# Patient Record
Sex: Female | Born: 1960 | Race: White | Hispanic: No | Marital: Married
Health system: Southern US, Community
[De-identification: ages and names within clinical notes are randomized; demographics above are authoritative.]

---

## 2006-09-12 ENCOUNTER — Other Ambulatory Visit: Admission: RE | Admit: 2006-09-12 | Discharge: 2006-09-12 | Payer: Self-pay | Admitting: Obstetrics and Gynecology

## 2007-05-09 ENCOUNTER — Ambulatory Visit (HOSPITAL_COMMUNITY): Admission: RE | Admit: 2007-05-09 | Discharge: 2007-05-09 | Payer: Self-pay | Admitting: Obstetrics and Gynecology

## 2007-12-23 ENCOUNTER — Other Ambulatory Visit: Admission: RE | Admit: 2007-12-23 | Discharge: 2007-12-23 | Payer: Self-pay | Admitting: Obstetrics and Gynecology

## 2008-05-11 ENCOUNTER — Ambulatory Visit (HOSPITAL_COMMUNITY): Admission: RE | Admit: 2008-05-11 | Discharge: 2008-05-11 | Payer: Self-pay | Admitting: Obstetrics and Gynecology

## 2008-05-12 ENCOUNTER — Ambulatory Visit (HOSPITAL_COMMUNITY): Admission: RE | Admit: 2008-05-12 | Discharge: 2008-05-13 | Payer: Self-pay | Admitting: Obstetrics and Gynecology

## 2008-05-12 ENCOUNTER — Encounter: Payer: Self-pay | Admitting: Obstetrics and Gynecology

## 2009-07-26 ENCOUNTER — Ambulatory Visit (HOSPITAL_COMMUNITY): Admission: RE | Admit: 2009-07-26 | Discharge: 2009-07-26 | Payer: Self-pay | Admitting: Obstetrics and Gynecology

## 2011-01-10 NOTE — Op Note (Signed)
[place]            ACCOUNT NO.:  [id number]   MEDICAL RECORD NO.:  [id number]          PATIENT TYPE:  OIB   LOCATION:  1320                         FACILITY:  [facility]   PHYSICIAN:  Cynthia P. Romine, M.D.DATE OF BIRTH:  07-07-1961   DATE OF PROCEDURE:  05/13/2008  DATE OF DISCHARGE:  05/13/2008                               OPERATIVE REPORT   PREOPERATIVE DIAGNOSES:  1. Menorrhagia.  2. Large uterine fibroids.   POSTOPERATIVE DIAGNOSES:  1. Menorrhagia.  2. Large uterine fibroids.  3. Pathology pending.   PROCEDURE:  1. Attempt at robotic total laparoscopic hysterectomy, abandoned after      the laparoscope was inserted due to uterine anatomy being not      amenable to the robotic approach.  2. Subsequent total abdominal hysterectomy.   SURGEON:  Cynthia P. Romine, MD   ASSISTANT:  Lum Keas, MD   ANESTHESIA:  General endotracheal.   ESTIMATED BLOOD LOSS:  175 mL.   COMPLICATIONS:  None.   PROCEDURE:  The patient was taken to the operating room and after  induction of adequate general endotracheal anesthesia was prepped and  draped in the usual fashion and placed in the dorsal lithotomy position.  A sterile speculum was inserted.  The cervix was grasped on its anterior  lip with a single-tooth tenaculum and a ZUMI uterine manipulator was  placed.  The bladder was drained with a red rubber catheter.  Attention  was next turned to the abdomen.  An incision was made about 3 cm long  about 3 fingerbreadths above the umbilicus.  It was carried down to the  fascia with blunt dissection.  The fascia was elevated with Kochers and  entered with Mayo scissors.  A pursestring suture was placed around the  fascia using 0 Vicryl.  The peritoneum was entered atraumatically.  The  Hasson sleeve was introduced into the peritoneal space.  The laparoscope  was then introduced and the pelvis inspected.  The uterus had a very  large anterior fibroid and some  adhesions of the bladder to the anterior  cervix and between the adhesions and the large anterior fibroid, it was  very difficult if not impossible to get over the fibroid and down to  create a bladder flap.  Additionally, there was a subserosal fibroid on  the patient's right at the level of the uterine artery and in addition,  the fundal fibroid made the uterus so heavy and so wide that even with  extreme manipulation I could not move the uterus away from the right  pelvic sidewall to see the tube and the round ligament.  It was felt  that a robotic approach would not be successful.  Therefore, the  decision was made to proceed with an open total abdominal hysterectomy.  The scope was removed.  The fascia was closed and the skin was closed  subcuticularly with 4-0 Vicryl Rapide and Dermabond.  A Pfannenstiel  incision was made and carried down to the fascia using the Bovie.  The  fascia was nicked and opened transversely.  Kochers were used to grasp  the fascial margins  that were separate from the underlying rectus  muscles using sharp dissection and the Bovie.  Rectus muscles were  separated in the midline.  The underlying peritoneum was elevated  between two hemostats and entered atraumatically.  The peritoneum was  opened vertically.  The uterus was as described on laparoscopy, being  very wide and deep and filling the cul-de-sac.  The tubes and ovaries  appeared normal and were retained.  With elevation of the uterus out  through the incision and traction to the patient's right side, the left  round ligament could be identified, stitched and opened down the  anterior leaf of the broad ligament to the midline.  A window was  created in the mesosalpinx and the pedicle containing the utero-ovarian  ligament and tube were clamped, cut and doubly tied.  Similarly the  procedure was repeated.  Elevating the uterus and retracting it to the  patient's left would allow identification of the  right round ligament.  This was sutured and then divided with the Bovie and the anterior leaf  of the broad ligament was taken down sharply.  Again a window was taken  and a window was made in the mesosalpinx and the pedicle containing the  tube and the utero-ovarian ligament clamped, cut and doubly tied.  Posterior leaf of the broad ligament was taken down sharply.  The  uterine arteries were skeletonized on each side.  The bladder was taken  down sharply.  The uterine arteries were clamped, cut and doubly tied on  each side.  The hysterectomy continued down the cardinal ligaments,  clamping, cutting and tying in sequence.  The uterosacral ligaments were  clamped, cut and held.  The specimen was then removed with Satinsky  scissors.  The vaginal cuff was grasped with Kochers and closed with  figure-of-eight sutures of 0 Vicryl.  Good hemostasis was achieved.  Pelvis was irrigated and was hemostatic.  The ovaries were tied up to  the round ligaments to keep them away from the vaginal cuff.  Uterosacral ligaments were tied together in the midline.  The bowel was  then allowed to return to its anatomic position after having been packed  away with a Balfour and wet lap.  The peritoneum was elevated with  hemostats and closed in a running fashion using 2-0 chromic.  A  subfascial On-Q pump was then inserted through a stab wound in the right  upper quadrant.  The fascia was then closed with a running suture of 0  Vicryl going from each angle towards the midline.  Subcutaneous tissue  was irrigated.  Hemostasis was achieved with the Bovie.  A subcutaneous  On-Q pump was then placed through a stab wound in the left upper  quadrant.  The skin was closed subcuticularly with 4-0 Vicryl Rapide,  Steri-Strips were applied, a dressing was applied, and the procedure was  terminated.  The vaginal instruments had been removed just prior to  removing the uterus and cervix.      Cynthia P. Romine,  M.D.  Electronically Signed     CPR/MEDQ  D:  05/12/2008  T:  05/13/2008  Job:  [id number]

## 2011-05-29 LAB — CBC
HCT: 31.4 — ABNORMAL LOW
Hemoglobin: 10.6 — ABNORMAL LOW
MCHC: 33.6
MCV: 93.1
Platelets: 203
RBC: 3.38 — ABNORMAL LOW
RDW: 13.8
WBC: 7.9

## 2011-05-31 LAB — CBC
HCT: 41.2
Hemoglobin: 13.7
MCHC: 33.3
MCV: 93.1
Platelets: 235
RBC: 4.42
RDW: 13.7
WBC: 5.5

## 2011-05-31 LAB — HCG, SERUM, QUALITATIVE: Preg, Serum: NEGATIVE

## 2012-03-14 ENCOUNTER — Other Ambulatory Visit (HOSPITAL_COMMUNITY): Payer: Self-pay | Admitting: Family Medicine

## 2012-03-14 DIAGNOSIS — Z1231 Encounter for screening mammogram for malignant neoplasm of breast: Secondary | ICD-10-CM

## 2012-04-03 ENCOUNTER — Ambulatory Visit (HOSPITAL_COMMUNITY)
Admission: RE | Admit: 2012-04-03 | Discharge: 2012-04-03 | Disposition: A | Discharge status: Home / Self Care | Payer: BC Managed Care – PPO | Source: Ambulatory Visit | Attending: Family Medicine | Admitting: Family Medicine

## 2012-04-03 DIAGNOSIS — Z1231 Encounter for screening mammogram for malignant neoplasm of breast: Secondary | ICD-10-CM | POA: Insufficient documentation

## 2014-01-07 ENCOUNTER — Other Ambulatory Visit (HOSPITAL_COMMUNITY): Payer: Self-pay | Admitting: Family Medicine

## 2014-01-07 DIAGNOSIS — Z1231 Encounter for screening mammogram for malignant neoplasm of breast: Secondary | ICD-10-CM

## 2014-01-09 ENCOUNTER — Ambulatory Visit (HOSPITAL_COMMUNITY)
Admission: RE | Admit: 2014-01-09 | Discharge: 2014-01-09 | Disposition: A | Discharge status: Home / Self Care | Payer: BC Managed Care – PPO | Source: Ambulatory Visit | Attending: Family Medicine | Admitting: Family Medicine

## 2014-01-09 DIAGNOSIS — Z1231 Encounter for screening mammogram for malignant neoplasm of breast: Secondary | ICD-10-CM | POA: Insufficient documentation

## 2014-04-02 ENCOUNTER — Other Ambulatory Visit: Payer: Self-pay | Admitting: Cardiology

## 2014-04-02 DIAGNOSIS — Z139 Encounter for screening, unspecified: Secondary | ICD-10-CM

## 2014-04-07 ENCOUNTER — Other Ambulatory Visit: Payer: BC Managed Care – PPO

## 2014-04-09 ENCOUNTER — Other Ambulatory Visit: Payer: BC Managed Care – PPO

## 2014-04-09 ENCOUNTER — Ambulatory Visit
Admission: RE | Admit: 2014-04-09 | Discharge: 2014-04-09 | Disposition: A | Discharge status: Home / Self Care | Payer: No Typology Code available for payment source | Source: Ambulatory Visit | Attending: Cardiology | Admitting: Cardiology

## 2014-04-09 DIAGNOSIS — Z139 Encounter for screening, unspecified: Secondary | ICD-10-CM

## 2014-04-14 ENCOUNTER — Ambulatory Visit: Payer: BC Managed Care – PPO | Admitting: Cardiology

## 2014-12-28 ENCOUNTER — Other Ambulatory Visit (HOSPITAL_COMMUNITY): Payer: Self-pay | Admitting: Obstetrics and Gynecology

## 2014-12-28 DIAGNOSIS — Z1231 Encounter for screening mammogram for malignant neoplasm of breast: Secondary | ICD-10-CM

## 2015-03-21 IMAGING — CT CT HEART SCORING
1 of 2 series · 11 of 20 positions shown, 14 images · non-contrast
Comparison: None.

CLINICAL DATA: Family history of coronary artery disease. Tightness
in chest.

EXAM:
CT HEART FOR CALCIUM SCORING
TECHNIQUE: CT heart was performed on a 256 channel system using prospective ECG
gating. A scout and noncontrast exam (for calcium scoring) were
performed. Note that this exam targets the heart and the chest was
not imaged in its entirety.

[Series 2: smartscore - gated 0.4 sec · axial · 0.41mm/px · z∈[-234,-119]mm · 11 of 56 slices shown, 14 images]
[im 5/56  vessel]
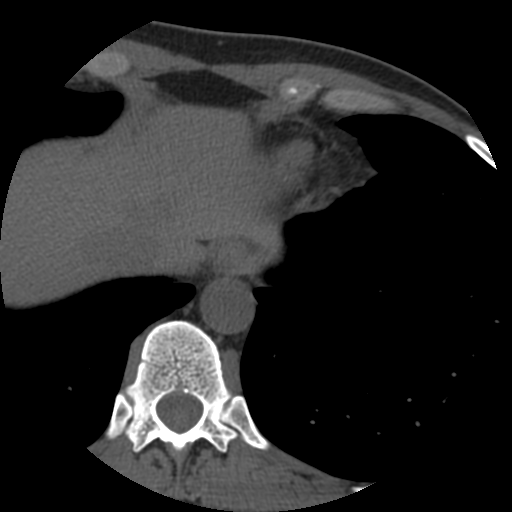
[im 5/56  lung]
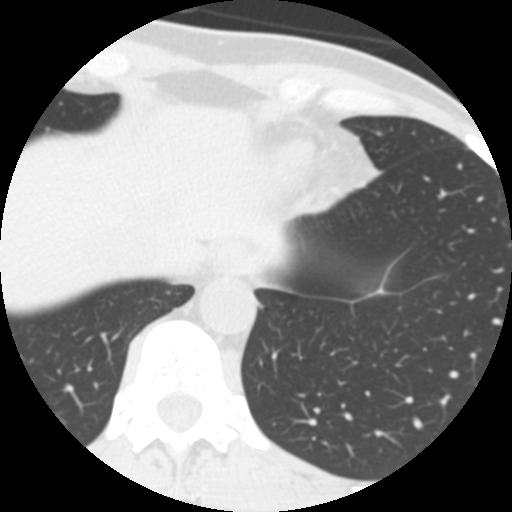
[im 10/56  vessel]
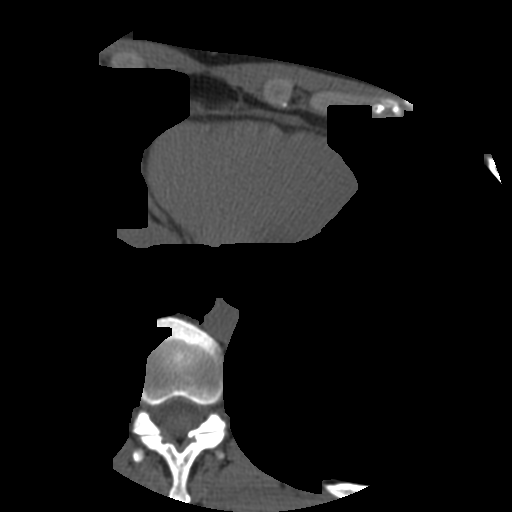
[im 14/56  vessel]
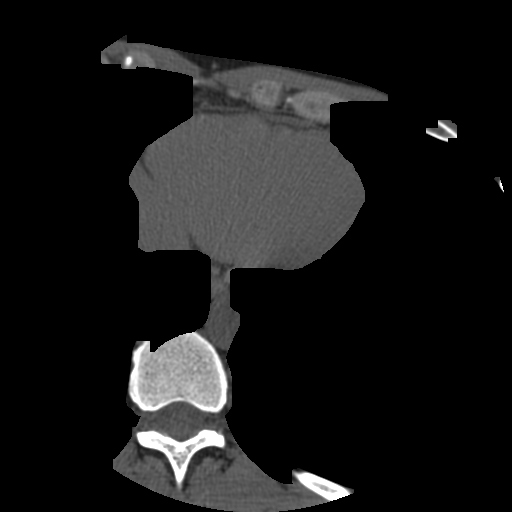
[im 19/56  vessel]
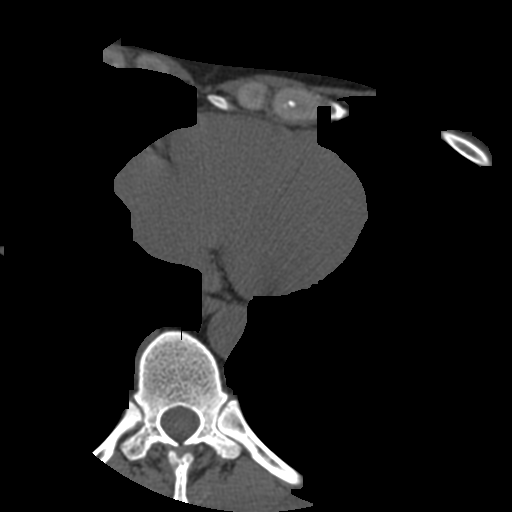
[im 23/56  vessel]
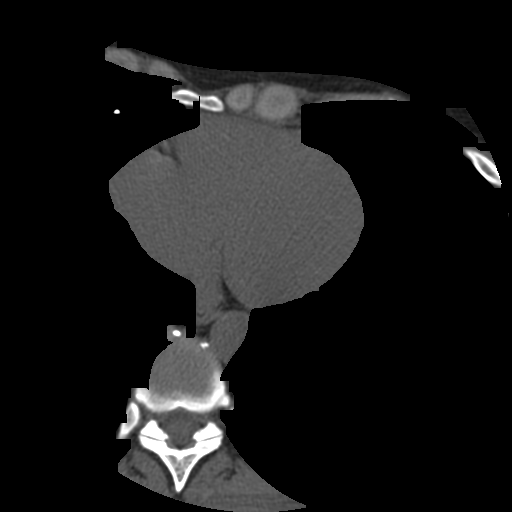
[im 23/56  lung]
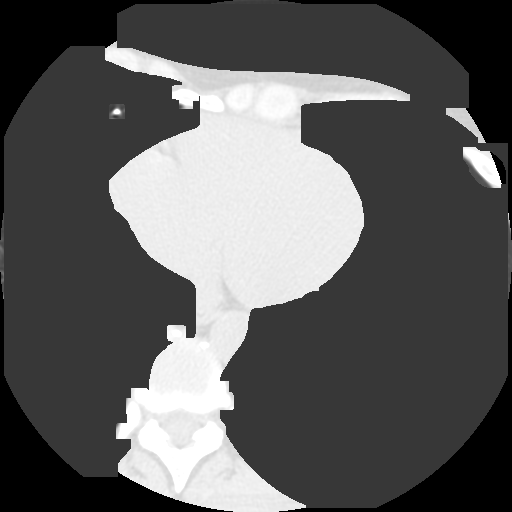
[im 28/56  vessel]
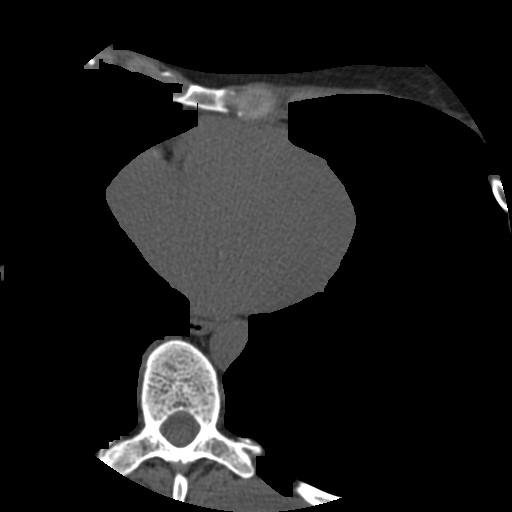
[im 33/56  vessel]
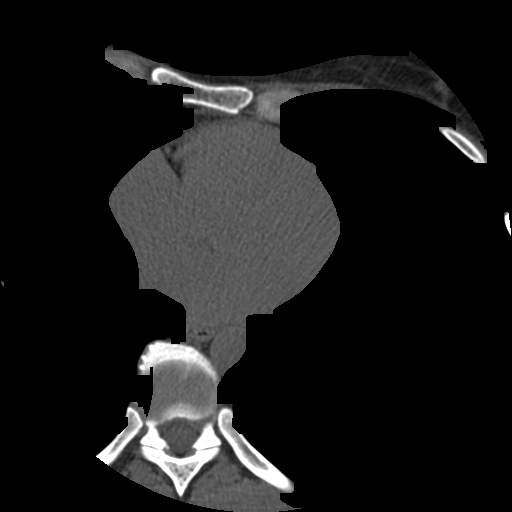
[im 37/56  vessel]
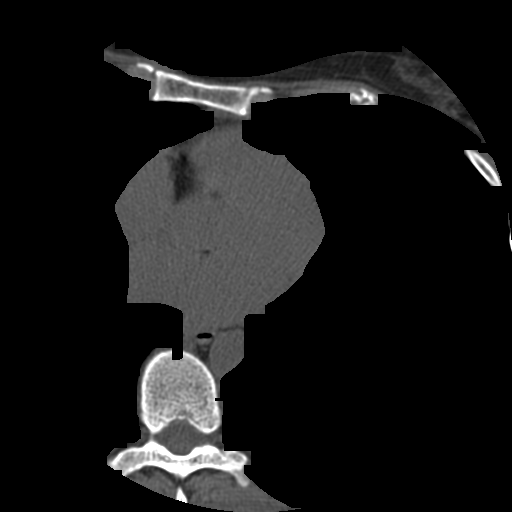
[im 42/56  vessel]
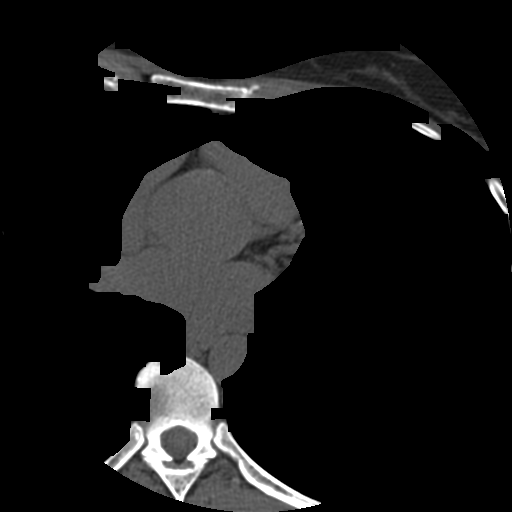
[im 42/56  lung]
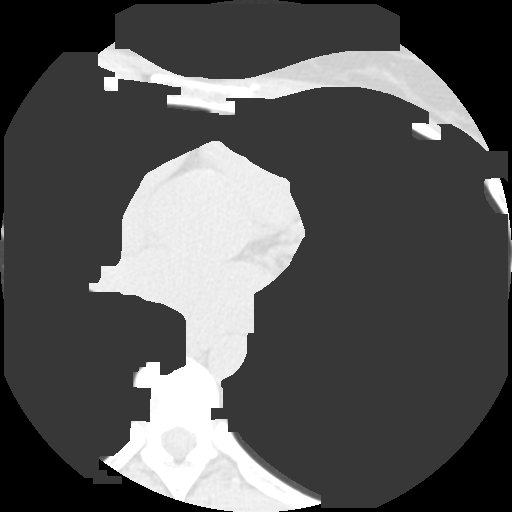
[im 46/56  vessel]
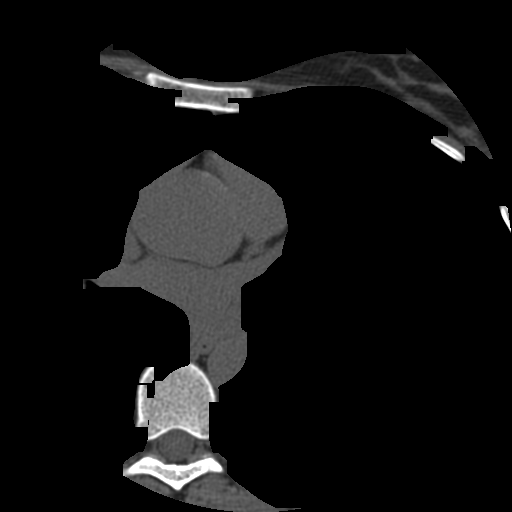
[im 51/56  vessel]
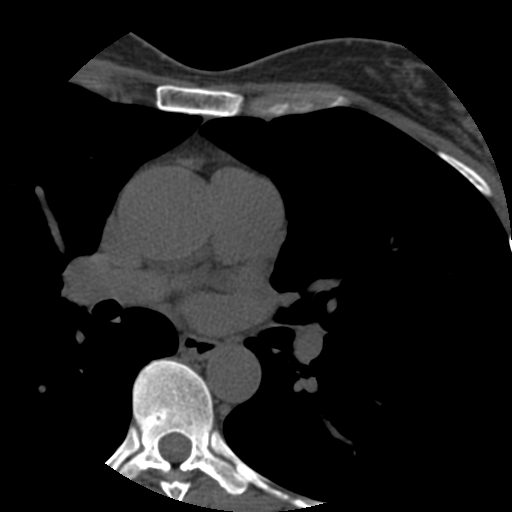

[11 of 20 positions shown; findings below may reference images not displayed]

FINDINGS: Technical quality: Good

CORONARY CALCIUM

Total Agatston Score: 51.9, all positioned within the mid to distal
LAD.

[HOSPITAL] percentile:  Ninety-fifth

AORTA AND PULMONARY MEASUREMENTS:

Aortic root (21 - 40 mm):

24 at the annulus

35 at the sinuses of Valsalva

32 at the sinotubular junction

Ascending aorta ( <  40 mm): 36

Descending aorta ( <  40 mm): 22

Main pulmonary artery:  ( <  30 mm): 26

EXTRACARDIAC FINDINGS:

Lungs/Pleura: 1 mm right upper lobe subpleural density on image 3 of
series 4 is likely a subpleural lymph node. Imaged lungs are
otherwise clear. No pleural fluid.

Mediastinum:  No image thoracic adenopathy.

Upper Abdomen:  No significant findings.

Bones/Musculoskeletal:  No acute osseous abnormality.
IMPRESSION: 1. Age advanced coronary artery calcification. LAD coronary artery
atherosclerosis with a calcium score of 51.9. This places the
patient in the ninety-fifth percentile for age, sex and race
correlate.
2. No acute process in the chest.

## 2015-03-29 ENCOUNTER — Ambulatory Visit (HOSPITAL_COMMUNITY)
Admission: RE | Admit: 2015-03-29 | Discharge: 2015-03-29 | Disposition: A | Discharge status: Home / Self Care | Payer: 59 | Source: Ambulatory Visit | Attending: Obstetrics and Gynecology | Admitting: Obstetrics and Gynecology

## 2015-03-29 ENCOUNTER — Other Ambulatory Visit: Payer: Self-pay | Admitting: Obstetrics and Gynecology

## 2015-03-29 DIAGNOSIS — Z1231 Encounter for screening mammogram for malignant neoplasm of breast: Secondary | ICD-10-CM | POA: Diagnosis not present

## 2015-03-30 LAB — CYTOLOGY - PAP

## 2017-06-20 ENCOUNTER — Other Ambulatory Visit: Payer: Self-pay | Admitting: Internal Medicine

## 2017-06-20 DIAGNOSIS — Z1231 Encounter for screening mammogram for malignant neoplasm of breast: Secondary | ICD-10-CM

## 2017-07-02 ENCOUNTER — Ambulatory Visit
Admission: RE | Admit: 2017-07-02 | Discharge: 2017-07-02 | Disposition: A | Discharge status: Home / Self Care | Payer: BLUE CROSS/BLUE SHIELD | Source: Ambulatory Visit | Attending: Internal Medicine | Admitting: Internal Medicine

## 2017-07-02 DIAGNOSIS — Z1231 Encounter for screening mammogram for malignant neoplasm of breast: Secondary | ICD-10-CM

## 2019-07-28 ENCOUNTER — Other Ambulatory Visit: Payer: Self-pay | Admitting: Family Medicine

## 2019-07-28 DIAGNOSIS — Z1231 Encounter for screening mammogram for malignant neoplasm of breast: Secondary | ICD-10-CM

## 2019-07-29 ENCOUNTER — Ambulatory Visit
Admission: RE | Admit: 2019-07-29 | Discharge: 2019-07-29 | Disposition: A | Discharge status: Home / Self Care | Payer: BC Managed Care – PPO | Source: Ambulatory Visit | Attending: Family Medicine | Admitting: Family Medicine

## 2019-07-29 ENCOUNTER — Other Ambulatory Visit: Payer: Self-pay

## 2019-07-29 DIAGNOSIS — Z1231 Encounter for screening mammogram for malignant neoplasm of breast: Secondary | ICD-10-CM
